# Patient Record
Sex: Female | Born: 1950 | Race: White | Hispanic: No | Smoking: Never smoker
Health system: Southern US, Community
[De-identification: ages and names within clinical notes are randomized; demographics above are authoritative.]

## PROBLEM LIST (undated history)

## (undated) DIAGNOSIS — E785 Hyperlipidemia, unspecified: Secondary | ICD-10-CM

## (undated) DIAGNOSIS — E079 Disorder of thyroid, unspecified: Secondary | ICD-10-CM

## (undated) DIAGNOSIS — I10 Essential (primary) hypertension: Secondary | ICD-10-CM

## (undated) DIAGNOSIS — N289 Disorder of kidney and ureter, unspecified: Secondary | ICD-10-CM

## (undated) HISTORY — PX: MASTECTOMY: SHX3

## (undated) HISTORY — PX: ABDOMINAL HYSTERECTOMY: SHX81

## (undated) HISTORY — PX: BREAST SURGERY: SHX581

---

## 2019-05-24 DIAGNOSIS — E039 Hypothyroidism, unspecified: Secondary | ICD-10-CM | POA: Diagnosis not present

## 2019-06-13 DIAGNOSIS — N183 Chronic kidney disease, stage 3 unspecified: Secondary | ICD-10-CM | POA: Diagnosis not present

## 2019-06-13 DIAGNOSIS — I10 Essential (primary) hypertension: Secondary | ICD-10-CM | POA: Diagnosis not present

## 2019-06-13 DIAGNOSIS — E039 Hypothyroidism, unspecified: Secondary | ICD-10-CM | POA: Diagnosis not present

## 2019-09-15 DIAGNOSIS — E7800 Pure hypercholesterolemia, unspecified: Secondary | ICD-10-CM | POA: Diagnosis not present

## 2019-09-15 DIAGNOSIS — I10 Essential (primary) hypertension: Secondary | ICD-10-CM | POA: Diagnosis not present

## 2019-09-15 DIAGNOSIS — N183 Chronic kidney disease, stage 3 unspecified: Secondary | ICD-10-CM | POA: Diagnosis not present

## 2019-09-29 DIAGNOSIS — N289 Disorder of kidney and ureter, unspecified: Secondary | ICD-10-CM | POA: Diagnosis not present

## 2019-10-07 DIAGNOSIS — Z9013 Acquired absence of bilateral breasts and nipples: Secondary | ICD-10-CM | POA: Diagnosis not present

## 2020-03-09 DIAGNOSIS — M6530 Trigger finger, unspecified finger: Secondary | ICD-10-CM | POA: Diagnosis not present

## 2020-03-09 DIAGNOSIS — E7800 Pure hypercholesterolemia, unspecified: Secondary | ICD-10-CM | POA: Diagnosis not present

## 2020-03-09 DIAGNOSIS — Z23 Encounter for immunization: Secondary | ICD-10-CM | POA: Diagnosis not present

## 2020-03-09 DIAGNOSIS — Z0000 Encounter for general adult medical examination without abnormal findings: Secondary | ICD-10-CM | POA: Diagnosis not present

## 2020-03-09 DIAGNOSIS — N183 Chronic kidney disease, stage 3 unspecified: Secondary | ICD-10-CM | POA: Diagnosis not present

## 2020-03-09 DIAGNOSIS — Z1211 Encounter for screening for malignant neoplasm of colon: Secondary | ICD-10-CM | POA: Diagnosis not present

## 2020-03-09 DIAGNOSIS — E039 Hypothyroidism, unspecified: Secondary | ICD-10-CM | POA: Diagnosis not present

## 2020-03-09 DIAGNOSIS — I10 Essential (primary) hypertension: Secondary | ICD-10-CM | POA: Diagnosis not present

## 2020-03-26 DIAGNOSIS — Z1211 Encounter for screening for malignant neoplasm of colon: Secondary | ICD-10-CM | POA: Diagnosis not present

## 2020-04-23 DIAGNOSIS — E039 Hypothyroidism, unspecified: Secondary | ICD-10-CM | POA: Diagnosis not present

## 2020-06-06 DIAGNOSIS — E039 Hypothyroidism, unspecified: Secondary | ICD-10-CM | POA: Diagnosis not present

## 2020-08-27 DIAGNOSIS — L609 Nail disorder, unspecified: Secondary | ICD-10-CM | POA: Diagnosis not present

## 2020-08-27 DIAGNOSIS — N183 Chronic kidney disease, stage 3 unspecified: Secondary | ICD-10-CM | POA: Diagnosis not present

## 2020-08-27 DIAGNOSIS — E039 Hypothyroidism, unspecified: Secondary | ICD-10-CM | POA: Diagnosis not present

## 2020-08-27 DIAGNOSIS — I10 Essential (primary) hypertension: Secondary | ICD-10-CM | POA: Diagnosis not present

## 2020-09-27 DIAGNOSIS — L578 Other skin changes due to chronic exposure to nonionizing radiation: Secondary | ICD-10-CM | POA: Diagnosis not present

## 2020-09-27 DIAGNOSIS — L570 Actinic keratosis: Secondary | ICD-10-CM | POA: Diagnosis not present

## 2020-09-27 DIAGNOSIS — L814 Other melanin hyperpigmentation: Secondary | ICD-10-CM | POA: Diagnosis not present

## 2020-09-27 DIAGNOSIS — D1801 Hemangioma of skin and subcutaneous tissue: Secondary | ICD-10-CM | POA: Diagnosis not present

## 2020-09-27 DIAGNOSIS — Z411 Encounter for cosmetic surgery: Secondary | ICD-10-CM | POA: Diagnosis not present

## 2020-09-27 DIAGNOSIS — I781 Nevus, non-neoplastic: Secondary | ICD-10-CM | POA: Diagnosis not present

## 2020-09-27 DIAGNOSIS — L821 Other seborrheic keratosis: Secondary | ICD-10-CM | POA: Diagnosis not present

## 2020-09-27 DIAGNOSIS — B079 Viral wart, unspecified: Secondary | ICD-10-CM | POA: Diagnosis not present

## 2020-09-27 DIAGNOSIS — B078 Other viral warts: Secondary | ICD-10-CM | POA: Diagnosis not present

## 2020-10-02 DIAGNOSIS — N1832 Chronic kidney disease, stage 3b: Secondary | ICD-10-CM | POA: Diagnosis not present

## 2020-10-02 DIAGNOSIS — E785 Hyperlipidemia, unspecified: Secondary | ICD-10-CM | POA: Diagnosis not present

## 2020-10-02 DIAGNOSIS — I129 Hypertensive chronic kidney disease with stage 1 through stage 4 chronic kidney disease, or unspecified chronic kidney disease: Secondary | ICD-10-CM | POA: Diagnosis not present

## 2020-12-22 ENCOUNTER — Other Ambulatory Visit: Payer: Self-pay

## 2020-12-22 ENCOUNTER — Emergency Department (HOSPITAL_BASED_OUTPATIENT_CLINIC_OR_DEPARTMENT_OTHER): Payer: Medicare PPO

## 2020-12-22 ENCOUNTER — Encounter (HOSPITAL_BASED_OUTPATIENT_CLINIC_OR_DEPARTMENT_OTHER): Payer: Self-pay | Admitting: *Deleted

## 2020-12-22 ENCOUNTER — Emergency Department (HOSPITAL_BASED_OUTPATIENT_CLINIC_OR_DEPARTMENT_OTHER)
Admission: EM | Admit: 2020-12-22 | Discharge: 2020-12-22 | Disposition: A | Discharge status: Home / Self Care | Payer: Medicare PPO | Attending: Emergency Medicine | Admitting: Emergency Medicine

## 2020-12-22 DIAGNOSIS — I10 Essential (primary) hypertension: Secondary | ICD-10-CM | POA: Insufficient documentation

## 2020-12-22 DIAGNOSIS — R350 Frequency of micturition: Secondary | ICD-10-CM | POA: Insufficient documentation

## 2020-12-22 DIAGNOSIS — R1012 Left upper quadrant pain: Secondary | ICD-10-CM | POA: Insufficient documentation

## 2020-12-22 DIAGNOSIS — Z9010 Acquired absence of unspecified breast and nipple: Secondary | ICD-10-CM | POA: Diagnosis not present

## 2020-12-22 DIAGNOSIS — Z79899 Other long term (current) drug therapy: Secondary | ICD-10-CM | POA: Insufficient documentation

## 2020-12-22 DIAGNOSIS — R109 Unspecified abdominal pain: Secondary | ICD-10-CM | POA: Diagnosis not present

## 2020-12-22 DIAGNOSIS — K5900 Constipation, unspecified: Secondary | ICD-10-CM | POA: Diagnosis not present

## 2020-12-22 DIAGNOSIS — M545 Low back pain, unspecified: Secondary | ICD-10-CM | POA: Diagnosis not present

## 2020-12-22 HISTORY — DX: Hyperlipidemia, unspecified: E78.5

## 2020-12-22 HISTORY — DX: Essential (primary) hypertension: I10

## 2020-12-22 HISTORY — DX: Disorder of kidney and ureter, unspecified: N28.9

## 2020-12-22 HISTORY — DX: Disorder of thyroid, unspecified: E07.9

## 2020-12-22 LAB — COMPREHENSIVE METABOLIC PANEL
ALT: 17 U/L (ref 0–44)
AST: 25 U/L (ref 15–41)
Albumin: 4.8 g/dL (ref 3.5–5.0)
Alkaline Phosphatase: 93 U/L (ref 38–126)
Anion gap: 11 (ref 5–15)
BUN: 15 mg/dL (ref 8–23)
CO2: 27 mmol/L (ref 22–32)
Calcium: 10.3 mg/dL (ref 8.9–10.3)
Chloride: 100 mmol/L (ref 98–111)
Creatinine, Ser: 1.23 mg/dL — ABNORMAL HIGH (ref 0.44–1.00)
GFR, Estimated: 48 mL/min — ABNORMAL LOW (ref >60)
Glucose, Bld: 79 mg/dL (ref 70–99)
Potassium: 3.5 mmol/L (ref 3.5–5.1)
Sodium: 138 mmol/L (ref 135–145)
Total Bilirubin: 0.7 mg/dL (ref 0.3–1.2)
Total Protein: 7.3 g/dL (ref 6.5–8.1)

## 2020-12-22 LAB — CBC
HCT: 39.3 % (ref 36.0–46.0)
Hemoglobin: 13.1 g/dL (ref 12.0–15.0)
MCH: 27.8 pg (ref 26.0–34.0)
MCHC: 33.3 g/dL (ref 30.0–36.0)
MCV: 83.3 fL (ref 80.0–100.0)
Platelets: 309 10*3/uL (ref 150–400)
RBC: 4.72 MIL/uL (ref 3.87–5.11)
RDW: 14.5 % (ref 11.5–15.5)
WBC: 6.8 10*3/uL (ref 4.0–10.5)
nRBC: 0 % (ref 0.0–0.2)

## 2020-12-22 LAB — URINALYSIS, ROUTINE W REFLEX MICROSCOPIC
Bilirubin Urine: NEGATIVE
Glucose, UA: NEGATIVE mg/dL
Ketones, ur: NEGATIVE mg/dL
Leukocytes,Ua: NEGATIVE
Nitrite: NEGATIVE
Protein, ur: NEGATIVE mg/dL
Specific Gravity, Urine: 1.01 (ref 1.005–1.030)
pH: 7 (ref 5.0–8.0)

## 2020-12-22 MED ORDER — SODIUM CHLORIDE 0.9 % IV BOLUS
500.0000 mL | Freq: Once | INTRAVENOUS | Status: AC
  Administered 2020-12-22: 500 mL via INTRAVENOUS

## 2020-12-22 MED ORDER — ONDANSETRON HCL 4 MG/2ML IJ SOLN: 4.0000 mg | Freq: Once | INTRAMUSCULAR | Status: DC

## 2020-12-22 MED ORDER — FENTANYL CITRATE PF 50 MCG/ML IJ SOSY: 50.0000 ug | PREFILLED_SYRINGE | Freq: Once | INTRAMUSCULAR | Status: DC

## 2020-12-22 NOTE — ED Triage Notes (Addendum)
Pt noticed pain in left flank/back area yesterday after putting up Christmas tree yesterday, more frequent urination last night, only gets a lttle relief when lying on her back.

## 2020-12-22 NOTE — ED Provider Notes (Signed)
[facility] EMERGENCY DEPT Provider Note   CSN: [id number] Arrival date & time: 12/22/20  0909     History Chief Complaint  Patient presents with   Flank Pain    Danielle Ramirez is a 70 y.o. female.  HPI    70 year old female presents today complaining of left-sided flank pain that began last night.  Onset was fairly abrupt.  She has had pain to 9 out of 10.  She took acetaminophen without relief.  Pain is sharp and severe.  She has not had similar symptoms in the past.  She has some increased frequency of urination up to 3 episodes last night with some pain.  She has not noted hematuria.  She has no past medical history of kidney stones and has not identified this pain is similar to any pain in the past.  Pain radiates up from the mid back area but does not really come into the abdomen.  She states the position of comfort is flat on her back.  It is worsened with being up and moving around.  She had nausea with episodes of vomiting last night with the pain.  She has not vomited today.  She took her morning medications and had some sips of coffee.  Past Medical History:  Diagnosis Date   Hyperlipemia    Hypertension    Renal disorder    Thyroid disease     There are no problems to display for this patient.   Past Surgical History:  Procedure Laterality Date   ABDOMINAL HYSTERECTOMY     BREAST SURGERY     MASTECTOMY       OB History   No obstetric history on file.     No family history on file.  Social History   Tobacco Use   Smoking status: Never   Smokeless tobacco: Never  Vaping Use   Vaping Use: Some days  Substance Use Topics   Alcohol use: Yes   Drug use: Never    Home Medications Prior to Admission medications   Medication Sig Start Date End Date Taking? Authorizing Provider  amLODipine (NORVASC) 10 MG tablet Take 5 mg by mouth daily.   Yes [provider]  atenolol (TENORMIN) 50 MG tablet Take 50 mg by mouth 2 (two) times daily.    Yes [provider]  desvenlafaxine (PRISTIQ) 100 MG 24 hr tablet Take 100 mg by mouth daily.   Yes [provider]  levothyroxine (SYNTHROID) 100 MCG tablet Take 100 mcg by mouth daily before breakfast.   Yes [provider]    Allergies    Erythromycin  Review of Systems   Review of Systems  All other systems reviewed and are negative.  Physical Exam Updated Vital Signs BP (!) 138/56   Pulse (!) 50   Temp 98.2 F (36.8 C) (Oral)   Resp 16   Ht 1.626 m (5\' 4" )   Wt 68 kg   SpO2 100%   BMI 25.75 kg/m   Physical Exam Vitals and nursing note reviewed.  Constitutional:      General: She is in acute distress.     Appearance: Normal appearance. She is not ill-appearing.  HENT:     Head: Normocephalic and atraumatic.     Right Ear: External ear normal.     Left Ear: External ear normal.     Nose: Nose normal.     Mouth/Throat:     Pharynx: Oropharynx is clear.  Eyes:     Extraocular  Movements: Extraocular movements intact.     Pupils: Pupils are equal, round, and reactive to light.  Cardiovascular:     Rate and Rhythm: Normal rate and regular rhythm.     Pulses: Normal pulses.     Heart sounds: Normal heart sounds.  Pulmonary:     Effort: Pulmonary effort is normal.  Abdominal:     General: Abdomen is flat.     Palpations: Abdomen is soft.     Tenderness: There is abdominal tenderness.     Comments: Tenderness to palpation left upper quadrant  Musculoskeletal:        General: No swelling, tenderness, deformity or signs of injury. Normal range of motion.     Cervical back: Normal range of motion.     Right lower leg: No edema.     Left lower leg: No edema.     Comments: Left CVA tenderness  Skin:    General: Skin is warm and dry.     Capillary Refill: Capillary refill takes less than 2 seconds.  Neurological:     General: No focal deficit present.     Mental Status: She is alert.  Psychiatric:        Mood and Affect: Mood normal.         Behavior: Behavior normal.    ED Results / Procedures / Treatments   Labs (all labs ordered are listed, but only abnormal results are displayed) Labs Reviewed  COMPREHENSIVE METABOLIC PANEL - Abnormal; Notable for the following components:      Result Value   Creatinine, Ser 1.23 (*)    GFR, Estimated 48 (*)    All other components within normal limits  URINALYSIS, ROUTINE W REFLEX MICROSCOPIC - Abnormal; Notable for the following components:   Color, Urine COLORLESS (*)    Hgb urine dipstick TRACE (*)    All other components within normal limits  CBC    EKG None  Radiology DG Chest Port 1 View  Result Date: 12/22/2020 CLINICAL DATA:  70 year old female with left flank and back pain. EXAM: PORTABLE CHEST 1 VIEW COMPARISON:  CT Abdomen and Pelvis reported separately. FINDINGS: Portable AP upright view at 1000 hours. Normal to mildly hyperinflated lungs. Normal cardiac size and mediastinal contours. Visualized tracheal air column is within normal limits. Allowing for portable technique the lungs are clear. No pneumothorax or pleural effusion. No acute osseous abnormality identified. IMPRESSION: Negative portable chest. Electronically Signed   By: Genevie Ann M.D.   On: 12/22/2020 10:12   CT Renal Stone Study  Result Date: 12/22/2020 CLINICAL DATA:  Flank pain, kidney stone suspected. Left-sided flank pain. EXAM: CT ABDOMEN AND PELVIS WITHOUT CONTRAST TECHNIQUE: Multidetector CT imaging of the abdomen and pelvis was performed following the standard protocol without IV contrast. COMPARISON:  None. FINDINGS: Lower chest: Few densities at the right lung base could represent small amount atelectasis or scarring. There appears to be a tiny diaphragmatic hernia along the posterior aspect of the right hemidiaphragm that is likely an incidental finding. Hepatobiliary: Normal appearance of the liver and gallbladder. Pancreas: Unremarkable. No pancreatic ductal dilatation or surrounding  inflammatory changes. Spleen: Normal in size without focal abnormality. Adrenals/Urinary Tract: Normal appearance of the adrenal glands. Normal appearance of both kidneys without kidney stones. No hydronephrosis. Indeterminate calcification in the right hemipelvis near the right ureter on sequence 2 image 56. This calcification measures roughly 9 mm and likely vascular in etiology due to the lack of right ureter dilatation. Normal appearance of the urinary  bladder. Stomach/Bowel: Moderate amount of stool in the colon. Oval-shaped density in the distal ileum is most compatible with an ingested pill. Appendix is not confidently identified but no inflammatory changes around the cecum. Vascular/Lymphatic: Atherosclerotic calcifications. Negative for abdominal aortic aneurysm. No abdominal or pelvic lymph node enlargement. Reproductive: Status post hysterectomy. No adnexal masses. Other: Negative for ascites. Musculoskeletal: No acute bone abnormality. IMPRESSION: 1. No acute abnormality in abdomen or pelvis. 2. No evidence for kidney stones or hydronephrosis. Electronically Signed   By: Markus Daft M.D.   On: 12/22/2020 10:24    Procedures Procedures   Medications Ordered in ED Medications  fentaNYL (SUBLIMAZE) injection 50 mcg (has no administration in time range)  ondansetron (ZOFRAN) injection 4 mg (has no administration in time range)  sodium chloride 0.9 % bolus 500 mL (500 mLs Intravenous New Bag/Given 12/22/20 1023)    ED Course  I have reviewed the triage vital signs and the nursing notes.  Pertinent labs & imaging results that were available during my care of the patient were reviewed by me and considered in my medical decision making (see chart for details).    MDM Rules/Calculators/A&P                          70 year old female with left flank pain who presents today with concern for kidney stone.  Evaluation here in the ED reveals no evidence of acute urinary tract infection,  pyelonephritis, ureteral stone or colic or other acute intra-abdominal etiology for pain. Patient has a history of CKD.  She is advised regarding using acetaminophen and other conservative pain management.  Her pain is worse with movement and this could represent musculoskeletal etiology.  Not appear to have any acute back signs of neurological dysfunction, specifically no weakness, perineal loss of sensation, loss of bowel or bladder control. Patient is advised regarding need for follow-up.  She is discharged in able condition. Final Clinical Impression(s) / ED Diagnoses Final diagnoses:  Acute left-sided low back pain without sciatica    Rx / DC Orders ED Discharge Orders     None        Pattricia Boss, MD 12/22/20 [phone]

## 2020-12-22 NOTE — ED Notes (Signed)
IV removed, per Gabriel Rung - RN.

## 2020-12-22 NOTE — ED Notes (Signed)
Dc instructions reviewed with patient. Patient voiced understanding. Dc with belongings.  °

## 2020-12-22 NOTE — ED Notes (Signed)
Patient transported to CT 

## 2020-12-24 ENCOUNTER — Other Ambulatory Visit: Payer: Self-pay | Admitting: Internal Medicine

## 2020-12-24 DIAGNOSIS — M545 Low back pain, unspecified: Secondary | ICD-10-CM | POA: Diagnosis not present

## 2020-12-24 DIAGNOSIS — I129 Hypertensive chronic kidney disease with stage 1 through stage 4 chronic kidney disease, or unspecified chronic kidney disease: Secondary | ICD-10-CM

## 2020-12-24 DIAGNOSIS — E785 Hyperlipidemia, unspecified: Secondary | ICD-10-CM

## 2020-12-24 DIAGNOSIS — N1832 Chronic kidney disease, stage 3b: Secondary | ICD-10-CM

## 2021-01-02 ENCOUNTER — Ambulatory Visit
Admission: RE | Admit: 2021-01-02 | Discharge: 2021-01-02 | Disposition: A | Discharge status: Home / Self Care | Payer: Medicare PPO | Source: Ambulatory Visit | Attending: Internal Medicine | Admitting: Internal Medicine

## 2021-01-02 DIAGNOSIS — N1832 Chronic kidney disease, stage 3b: Secondary | ICD-10-CM

## 2021-01-02 DIAGNOSIS — I129 Hypertensive chronic kidney disease with stage 1 through stage 4 chronic kidney disease, or unspecified chronic kidney disease: Secondary | ICD-10-CM | POA: Diagnosis not present

## 2021-01-02 DIAGNOSIS — N189 Chronic kidney disease, unspecified: Secondary | ICD-10-CM | POA: Diagnosis not present

## 2021-01-02 DIAGNOSIS — E785 Hyperlipidemia, unspecified: Secondary | ICD-10-CM

## 2021-01-15 DIAGNOSIS — J209 Acute bronchitis, unspecified: Secondary | ICD-10-CM | POA: Diagnosis not present

## 2021-01-15 DIAGNOSIS — J019 Acute sinusitis, unspecified: Secondary | ICD-10-CM | POA: Diagnosis not present

## 2021-03-04 DIAGNOSIS — R059 Cough, unspecified: Secondary | ICD-10-CM | POA: Diagnosis not present

## 2021-03-04 DIAGNOSIS — R0981 Nasal congestion: Secondary | ICD-10-CM | POA: Diagnosis not present

## 2021-03-04 DIAGNOSIS — Z03818 Encounter for observation for suspected exposure to other biological agents ruled out: Secondary | ICD-10-CM | POA: Diagnosis not present

## 2021-03-04 DIAGNOSIS — J069 Acute upper respiratory infection, unspecified: Secondary | ICD-10-CM | POA: Diagnosis not present

## 2021-03-22 DIAGNOSIS — N183 Chronic kidney disease, stage 3 unspecified: Secondary | ICD-10-CM | POA: Diagnosis not present

## 2021-03-22 DIAGNOSIS — Z1211 Encounter for screening for malignant neoplasm of colon: Secondary | ICD-10-CM | POA: Diagnosis not present

## 2021-03-22 DIAGNOSIS — E7800 Pure hypercholesterolemia, unspecified: Secondary | ICD-10-CM | POA: Diagnosis not present

## 2021-03-22 DIAGNOSIS — E039 Hypothyroidism, unspecified: Secondary | ICD-10-CM | POA: Diagnosis not present

## 2021-03-22 DIAGNOSIS — Z0000 Encounter for general adult medical examination without abnormal findings: Secondary | ICD-10-CM | POA: Diagnosis not present

## 2021-03-22 DIAGNOSIS — I10 Essential (primary) hypertension: Secondary | ICD-10-CM | POA: Diagnosis not present

## 2021-03-22 DIAGNOSIS — F322 Major depressive disorder, single episode, severe without psychotic features: Secondary | ICD-10-CM | POA: Diagnosis not present

## 2021-04-02 DIAGNOSIS — Z1211 Encounter for screening for malignant neoplasm of colon: Secondary | ICD-10-CM | POA: Diagnosis not present

## 2021-08-21 DIAGNOSIS — F322 Major depressive disorder, single episode, severe without psychotic features: Secondary | ICD-10-CM | POA: Diagnosis not present

## 2021-08-21 DIAGNOSIS — R609 Edema, unspecified: Secondary | ICD-10-CM | POA: Diagnosis not present

## 2021-08-21 DIAGNOSIS — R413 Other amnesia: Secondary | ICD-10-CM | POA: Diagnosis not present

## 2021-08-21 DIAGNOSIS — N183 Chronic kidney disease, stage 3 unspecified: Secondary | ICD-10-CM | POA: Diagnosis not present

## 2021-08-21 DIAGNOSIS — E039 Hypothyroidism, unspecified: Secondary | ICD-10-CM | POA: Diagnosis not present

## 2021-08-21 DIAGNOSIS — I10 Essential (primary) hypertension: Secondary | ICD-10-CM | POA: Diagnosis not present

## 2021-09-24 DIAGNOSIS — I10 Essential (primary) hypertension: Secondary | ICD-10-CM | POA: Diagnosis not present

## 2021-09-24 DIAGNOSIS — E039 Hypothyroidism, unspecified: Secondary | ICD-10-CM | POA: Diagnosis not present

## 2021-09-24 DIAGNOSIS — E673 Hypervitaminosis D: Secondary | ICD-10-CM | POA: Diagnosis not present

## 2021-09-24 DIAGNOSIS — Z23 Encounter for immunization: Secondary | ICD-10-CM | POA: Diagnosis not present

## 2021-10-08 DIAGNOSIS — E673 Hypervitaminosis D: Secondary | ICD-10-CM | POA: Diagnosis not present

## 2021-10-08 DIAGNOSIS — E039 Hypothyroidism, unspecified: Secondary | ICD-10-CM | POA: Diagnosis not present

## 2021-10-15 DIAGNOSIS — B078 Other viral warts: Secondary | ICD-10-CM | POA: Diagnosis not present

## 2021-10-15 DIAGNOSIS — L821 Other seborrheic keratosis: Secondary | ICD-10-CM | POA: Diagnosis not present

## 2021-10-15 DIAGNOSIS — L814 Other melanin hyperpigmentation: Secondary | ICD-10-CM | POA: Diagnosis not present

## 2021-10-15 DIAGNOSIS — L820 Inflamed seborrheic keratosis: Secondary | ICD-10-CM | POA: Diagnosis not present

## 2021-10-15 DIAGNOSIS — D2271 Melanocytic nevi of right lower limb, including hip: Secondary | ICD-10-CM | POA: Diagnosis not present

## 2021-10-15 DIAGNOSIS — D0371 Melanoma in situ of right lower limb, including hip: Secondary | ICD-10-CM | POA: Diagnosis not present

## 2021-10-15 DIAGNOSIS — D229 Melanocytic nevi, unspecified: Secondary | ICD-10-CM | POA: Diagnosis not present

## 2021-10-15 DIAGNOSIS — L739 Follicular disorder, unspecified: Secondary | ICD-10-CM | POA: Diagnosis not present

## 2021-10-15 DIAGNOSIS — L570 Actinic keratosis: Secondary | ICD-10-CM | POA: Diagnosis not present

## 2021-10-15 DIAGNOSIS — L578 Other skin changes due to chronic exposure to nonionizing radiation: Secondary | ICD-10-CM | POA: Diagnosis not present

## 2021-10-15 DIAGNOSIS — C44311 Basal cell carcinoma of skin of nose: Secondary | ICD-10-CM | POA: Diagnosis not present

## 2021-10-15 DIAGNOSIS — D485 Neoplasm of uncertain behavior of skin: Secondary | ICD-10-CM | POA: Diagnosis not present

## 2021-10-21 DIAGNOSIS — D0371 Melanoma in situ of right lower limb, including hip: Secondary | ICD-10-CM | POA: Diagnosis not present

## 2021-10-30 DIAGNOSIS — C44311 Basal cell carcinoma of skin of nose: Secondary | ICD-10-CM | POA: Diagnosis not present

## 2021-11-14 DIAGNOSIS — J0100 Acute maxillary sinusitis, unspecified: Secondary | ICD-10-CM | POA: Diagnosis not present

## 2022-01-02 DIAGNOSIS — Z5189 Encounter for other specified aftercare: Secondary | ICD-10-CM | POA: Diagnosis not present

## 2022-01-02 DIAGNOSIS — Z85828 Personal history of other malignant neoplasm of skin: Secondary | ICD-10-CM | POA: Diagnosis not present

## 2022-01-21 DIAGNOSIS — E673 Hypervitaminosis D: Secondary | ICD-10-CM | POA: Diagnosis not present

## 2022-01-21 DIAGNOSIS — E039 Hypothyroidism, unspecified: Secondary | ICD-10-CM | POA: Diagnosis not present

## 2022-02-04 DIAGNOSIS — N183 Chronic kidney disease, stage 3 unspecified: Secondary | ICD-10-CM | POA: Diagnosis not present

## 2022-02-04 DIAGNOSIS — U071 COVID-19: Secondary | ICD-10-CM | POA: Diagnosis not present

## 2022-02-14 DIAGNOSIS — J988 Other specified respiratory disorders: Secondary | ICD-10-CM | POA: Diagnosis not present

## 2022-02-26 DIAGNOSIS — F331 Major depressive disorder, recurrent, moderate: Secondary | ICD-10-CM | POA: Diagnosis not present

## 2022-02-26 DIAGNOSIS — N1831 Chronic kidney disease, stage 3a: Secondary | ICD-10-CM | POA: Diagnosis not present

## 2022-02-26 DIAGNOSIS — J069 Acute upper respiratory infection, unspecified: Secondary | ICD-10-CM | POA: Diagnosis not present

## 2022-02-26 DIAGNOSIS — J3489 Other specified disorders of nose and nasal sinuses: Secondary | ICD-10-CM | POA: Diagnosis not present

## 2022-02-26 DIAGNOSIS — R058 Other specified cough: Secondary | ICD-10-CM | POA: Diagnosis not present

## 2022-04-03 DIAGNOSIS — E039 Hypothyroidism, unspecified: Secondary | ICD-10-CM | POA: Diagnosis not present

## 2022-04-03 DIAGNOSIS — J302 Other seasonal allergic rhinitis: Secondary | ICD-10-CM | POA: Diagnosis not present

## 2022-04-03 DIAGNOSIS — Z1211 Encounter for screening for malignant neoplasm of colon: Secondary | ICD-10-CM | POA: Diagnosis not present

## 2022-04-03 DIAGNOSIS — F325 Major depressive disorder, single episode, in full remission: Secondary | ICD-10-CM | POA: Diagnosis not present

## 2022-04-03 DIAGNOSIS — I10 Essential (primary) hypertension: Secondary | ICD-10-CM | POA: Diagnosis not present

## 2022-04-03 DIAGNOSIS — N1831 Chronic kidney disease, stage 3a: Secondary | ICD-10-CM | POA: Diagnosis not present

## 2022-04-03 DIAGNOSIS — E2839 Other primary ovarian failure: Secondary | ICD-10-CM | POA: Diagnosis not present

## 2022-04-03 DIAGNOSIS — Z0001 Encounter for general adult medical examination with abnormal findings: Secondary | ICD-10-CM | POA: Diagnosis not present

## 2022-04-04 ENCOUNTER — Other Ambulatory Visit: Payer: Self-pay | Admitting: Family Medicine

## 2022-04-04 DIAGNOSIS — E2839 Other primary ovarian failure: Secondary | ICD-10-CM

## 2022-04-10 DIAGNOSIS — H2513 Age-related nuclear cataract, bilateral: Secondary | ICD-10-CM | POA: Diagnosis not present

## 2022-04-10 DIAGNOSIS — H52223 Regular astigmatism, bilateral: Secondary | ICD-10-CM | POA: Diagnosis not present

## 2022-04-10 DIAGNOSIS — H524 Presbyopia: Secondary | ICD-10-CM | POA: Diagnosis not present

## 2022-04-10 DIAGNOSIS — H5203 Hypermetropia, bilateral: Secondary | ICD-10-CM | POA: Diagnosis not present

## 2022-04-15 DIAGNOSIS — D229 Melanocytic nevi, unspecified: Secondary | ICD-10-CM | POA: Diagnosis not present

## 2022-04-15 DIAGNOSIS — L821 Other seborrheic keratosis: Secondary | ICD-10-CM | POA: Diagnosis not present

## 2022-04-15 DIAGNOSIS — L570 Actinic keratosis: Secondary | ICD-10-CM | POA: Diagnosis not present

## 2022-04-15 DIAGNOSIS — L814 Other melanin hyperpigmentation: Secondary | ICD-10-CM | POA: Diagnosis not present

## 2022-04-15 DIAGNOSIS — L578 Other skin changes due to chronic exposure to nonionizing radiation: Secondary | ICD-10-CM | POA: Diagnosis not present

## 2022-04-15 DIAGNOSIS — D1801 Hemangioma of skin and subcutaneous tissue: Secondary | ICD-10-CM | POA: Diagnosis not present

## 2022-04-15 DIAGNOSIS — T8189XD Other complications of procedures, not elsewhere classified, subsequent encounter: Secondary | ICD-10-CM | POA: Diagnosis not present

## 2022-04-16 DIAGNOSIS — Z1211 Encounter for screening for malignant neoplasm of colon: Secondary | ICD-10-CM | POA: Diagnosis not present

## 2022-04-29 DIAGNOSIS — Z79899 Other long term (current) drug therapy: Secondary | ICD-10-CM | POA: Diagnosis not present

## 2022-05-14 DIAGNOSIS — J302 Other seasonal allergic rhinitis: Secondary | ICD-10-CM | POA: Diagnosis not present

## 2022-05-14 DIAGNOSIS — I10 Essential (primary) hypertension: Secondary | ICD-10-CM | POA: Diagnosis not present

## 2022-05-14 DIAGNOSIS — J4520 Mild intermittent asthma, uncomplicated: Secondary | ICD-10-CM | POA: Diagnosis not present

## 2022-08-20 DIAGNOSIS — F322 Major depressive disorder, single episode, severe without psychotic features: Secondary | ICD-10-CM | POA: Diagnosis not present

## 2022-09-17 DIAGNOSIS — E039 Hypothyroidism, unspecified: Secondary | ICD-10-CM | POA: Diagnosis not present

## 2022-09-17 DIAGNOSIS — Z79899 Other long term (current) drug therapy: Secondary | ICD-10-CM | POA: Diagnosis not present

## 2022-09-17 DIAGNOSIS — F329 Major depressive disorder, single episode, unspecified: Secondary | ICD-10-CM | POA: Diagnosis not present

## 2022-10-13 DIAGNOSIS — I10 Essential (primary) hypertension: Secondary | ICD-10-CM | POA: Diagnosis not present

## 2022-10-17 ENCOUNTER — Inpatient Hospital Stay: Admission: RE | Admit: 2022-10-17 | Payer: Medicare PPO | Source: Ambulatory Visit

## 2022-10-30 DIAGNOSIS — Z411 Encounter for cosmetic surgery: Secondary | ICD-10-CM | POA: Diagnosis not present

## 2022-10-30 DIAGNOSIS — L578 Other skin changes due to chronic exposure to nonionizing radiation: Secondary | ICD-10-CM | POA: Diagnosis not present

## 2022-10-30 DIAGNOSIS — L570 Actinic keratosis: Secondary | ICD-10-CM | POA: Diagnosis not present

## 2022-10-30 DIAGNOSIS — D229 Melanocytic nevi, unspecified: Secondary | ICD-10-CM | POA: Diagnosis not present

## 2022-10-30 DIAGNOSIS — D2230 Melanocytic nevi of unspecified part of face: Secondary | ICD-10-CM | POA: Diagnosis not present

## 2022-10-30 DIAGNOSIS — L814 Other melanin hyperpigmentation: Secondary | ICD-10-CM | POA: Diagnosis not present

## 2022-10-30 DIAGNOSIS — L821 Other seborrheic keratosis: Secondary | ICD-10-CM | POA: Diagnosis not present

## 2022-10-30 DIAGNOSIS — Z86006 Personal history of melanoma in-situ: Secondary | ICD-10-CM | POA: Diagnosis not present

## 2023-01-01 DIAGNOSIS — I10 Essential (primary) hypertension: Secondary | ICD-10-CM | POA: Diagnosis not present

## 2023-01-01 DIAGNOSIS — J069 Acute upper respiratory infection, unspecified: Secondary | ICD-10-CM | POA: Diagnosis not present

## 2023-01-05 DIAGNOSIS — J988 Other specified respiratory disorders: Secondary | ICD-10-CM | POA: Diagnosis not present

## 2023-01-05 DIAGNOSIS — J0100 Acute maxillary sinusitis, unspecified: Secondary | ICD-10-CM | POA: Diagnosis not present

## 2023-04-10 DIAGNOSIS — M6530 Trigger finger, unspecified finger: Secondary | ICD-10-CM | POA: Diagnosis not present

## 2023-04-10 DIAGNOSIS — Z79899 Other long term (current) drug therapy: Secondary | ICD-10-CM | POA: Diagnosis not present

## 2023-04-10 DIAGNOSIS — I10 Essential (primary) hypertension: Secondary | ICD-10-CM | POA: Diagnosis not present

## 2023-04-10 DIAGNOSIS — E039 Hypothyroidism, unspecified: Secondary | ICD-10-CM | POA: Diagnosis not present

## 2023-04-10 DIAGNOSIS — F322 Major depressive disorder, single episode, severe without psychotic features: Secondary | ICD-10-CM | POA: Diagnosis not present

## 2023-04-10 DIAGNOSIS — Z1211 Encounter for screening for malignant neoplasm of colon: Secondary | ICD-10-CM | POA: Diagnosis not present

## 2023-04-10 DIAGNOSIS — N1831 Chronic kidney disease, stage 3a: Secondary | ICD-10-CM | POA: Diagnosis not present

## 2023-04-10 DIAGNOSIS — Z0001 Encounter for general adult medical examination with abnormal findings: Secondary | ICD-10-CM | POA: Diagnosis not present

## 2023-04-10 DIAGNOSIS — E2839 Other primary ovarian failure: Secondary | ICD-10-CM | POA: Diagnosis not present

## 2023-04-14 ENCOUNTER — Other Ambulatory Visit: Payer: Self-pay | Admitting: Family Medicine

## 2023-04-14 DIAGNOSIS — E2839 Other primary ovarian failure: Secondary | ICD-10-CM

## 2023-05-15 DIAGNOSIS — M65331 Trigger finger, right middle finger: Secondary | ICD-10-CM | POA: Diagnosis not present

## 2023-05-27 DIAGNOSIS — L578 Other skin changes due to chronic exposure to nonionizing radiation: Secondary | ICD-10-CM | POA: Diagnosis not present

## 2023-05-27 DIAGNOSIS — Z86006 Personal history of melanoma in-situ: Secondary | ICD-10-CM | POA: Diagnosis not present

## 2023-05-27 DIAGNOSIS — L821 Other seborrheic keratosis: Secondary | ICD-10-CM | POA: Diagnosis not present

## 2023-05-27 DIAGNOSIS — L814 Other melanin hyperpigmentation: Secondary | ICD-10-CM | POA: Diagnosis not present

## 2023-05-27 DIAGNOSIS — D229 Melanocytic nevi, unspecified: Secondary | ICD-10-CM | POA: Diagnosis not present

## 2023-05-27 DIAGNOSIS — D485 Neoplasm of uncertain behavior of skin: Secondary | ICD-10-CM | POA: Diagnosis not present

## 2023-05-27 DIAGNOSIS — D0472 Carcinoma in situ of skin of left lower limb, including hip: Secondary | ICD-10-CM | POA: Diagnosis not present

## 2023-05-27 DIAGNOSIS — D369 Benign neoplasm, unspecified site: Secondary | ICD-10-CM | POA: Diagnosis not present

## 2023-05-27 DIAGNOSIS — I781 Nevus, non-neoplastic: Secondary | ICD-10-CM | POA: Diagnosis not present

## 2023-05-27 DIAGNOSIS — D2230 Melanocytic nevi of unspecified part of face: Secondary | ICD-10-CM | POA: Diagnosis not present

## 2023-06-03 DIAGNOSIS — Z1211 Encounter for screening for malignant neoplasm of colon: Secondary | ICD-10-CM | POA: Diagnosis not present

## 2023-06-19 DIAGNOSIS — E039 Hypothyroidism, unspecified: Secondary | ICD-10-CM | POA: Diagnosis not present

## 2023-06-23 DIAGNOSIS — D485 Neoplasm of uncertain behavior of skin: Secondary | ICD-10-CM | POA: Diagnosis not present

## 2023-06-23 DIAGNOSIS — C44311 Basal cell carcinoma of skin of nose: Secondary | ICD-10-CM | POA: Diagnosis not present

## 2023-06-23 DIAGNOSIS — D0472 Carcinoma in situ of skin of left lower limb, including hip: Secondary | ICD-10-CM | POA: Diagnosis not present

## 2023-06-23 DIAGNOSIS — C44319 Basal cell carcinoma of skin of other parts of face: Secondary | ICD-10-CM | POA: Diagnosis not present

## 2023-07-02 DIAGNOSIS — C44311 Basal cell carcinoma of skin of nose: Secondary | ICD-10-CM | POA: Diagnosis not present

## 2023-07-09 ENCOUNTER — Ambulatory Visit (HOSPITAL_BASED_OUTPATIENT_CLINIC_OR_DEPARTMENT_OTHER)
Admission: RE | Admit: 2023-07-09 | Discharge: 2023-07-09 | Disposition: A | Discharge status: Home / Self Care | Source: Ambulatory Visit | Attending: Family Medicine | Admitting: Family Medicine

## 2023-07-09 DIAGNOSIS — Z780 Asymptomatic menopausal state: Secondary | ICD-10-CM | POA: Diagnosis not present

## 2023-07-09 DIAGNOSIS — E2839 Other primary ovarian failure: Secondary | ICD-10-CM | POA: Insufficient documentation

## 2023-07-09 DIAGNOSIS — M85852 Other specified disorders of bone density and structure, left thigh: Secondary | ICD-10-CM | POA: Diagnosis not present

## 2023-07-10 DIAGNOSIS — H2513 Age-related nuclear cataract, bilateral: Secondary | ICD-10-CM | POA: Diagnosis not present

## 2023-07-14 DIAGNOSIS — L0889 Other specified local infections of the skin and subcutaneous tissue: Secondary | ICD-10-CM | POA: Diagnosis not present

## 2023-08-07 IMAGING — CT CT RENAL STONE PROTOCOL
2 of 4 series · 16 of 46 positions shown, 18 images · non-contrast
Comparison: None.

CLINICAL DATA: Flank pain, kidney stone suspected. Left-sided flank
pain.

EXAM:
CT ABDOMEN AND PELVIS WITHOUT CONTRAST
TECHNIQUE: Multidetector CT imaging of the abdomen and pelvis was performed
following the standard protocol without IV contrast.

[Series 2: stone full · axial · 0.77mm/px · z∈[+694,+1074]mm · 13 of 84 slices shown, 15 images]
[im 4/84  soft-tissue]
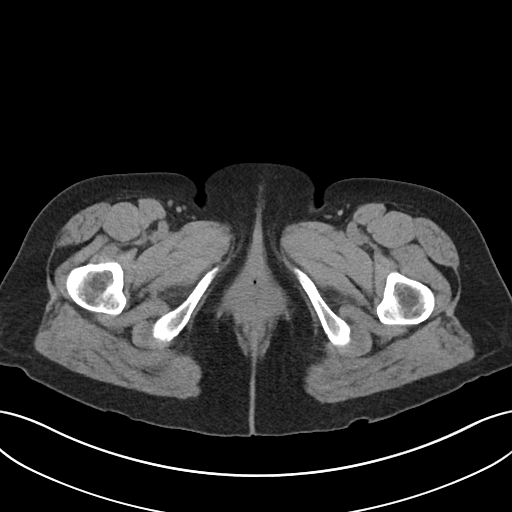
[im 4/84  bone]
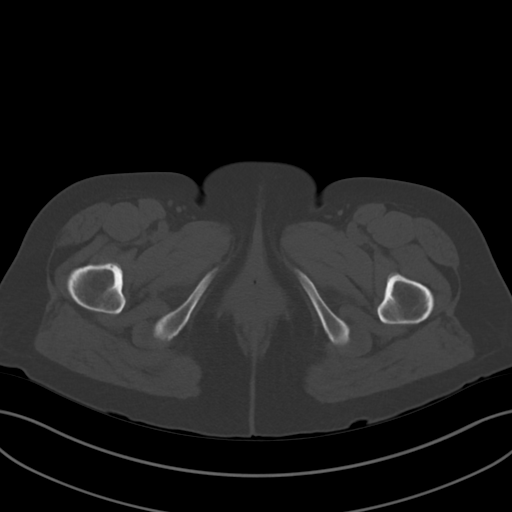
[im 10/84  soft-tissue]
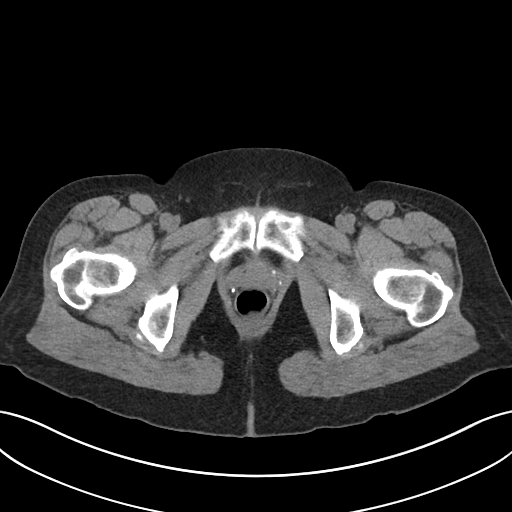
[im 17/84  soft-tissue]
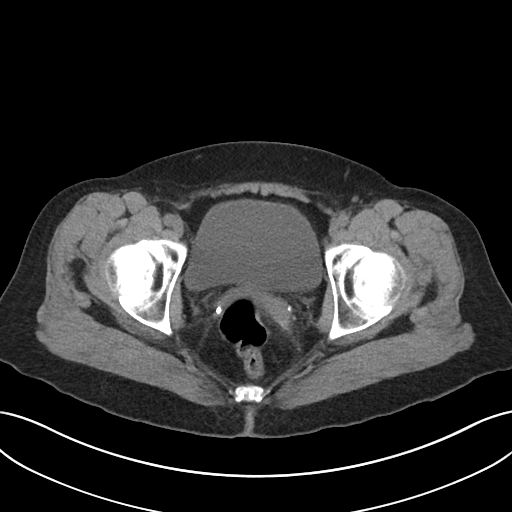
[im 24/84  soft-tissue]
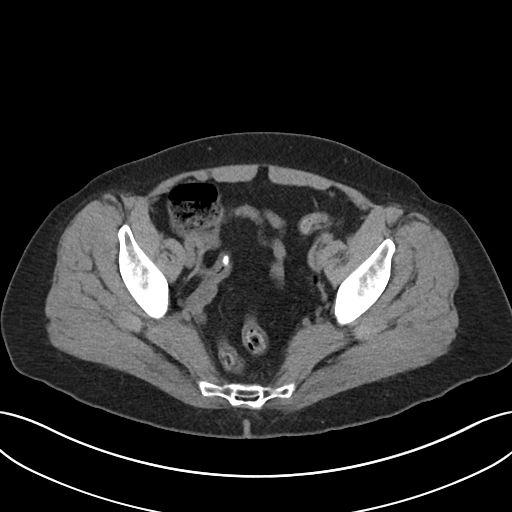
[im 30/84  soft-tissue]
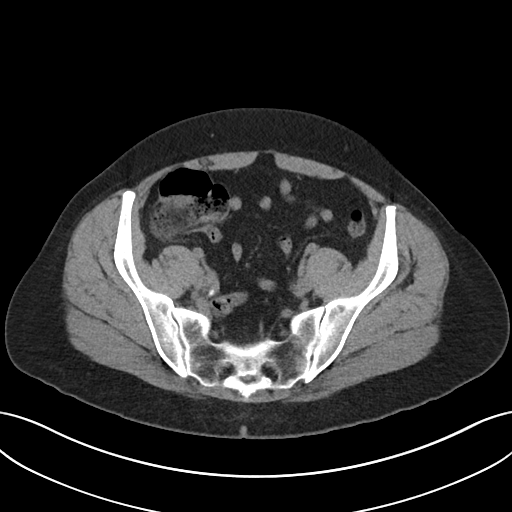
[im 37/84  soft-tissue]
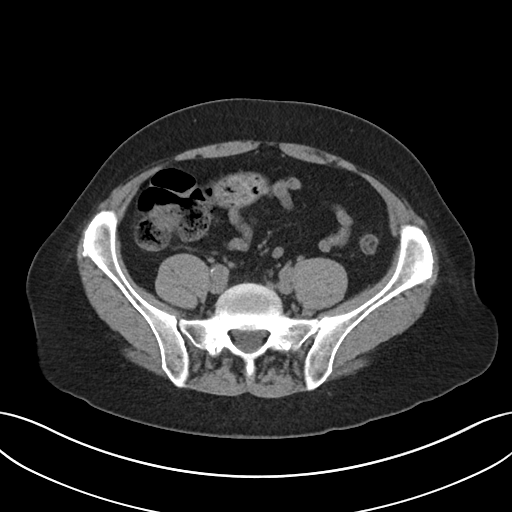
[im 44/84  soft-tissue]
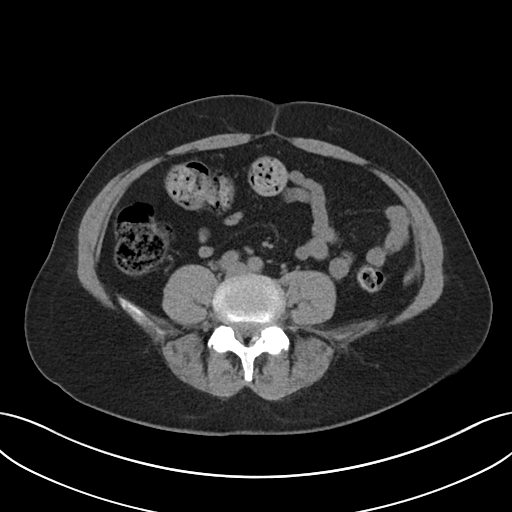
[im 47/84  soft-tissue]
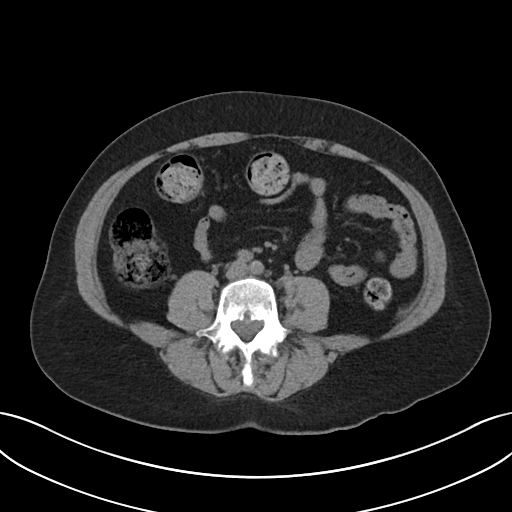
[im 54/84  soft-tissue]
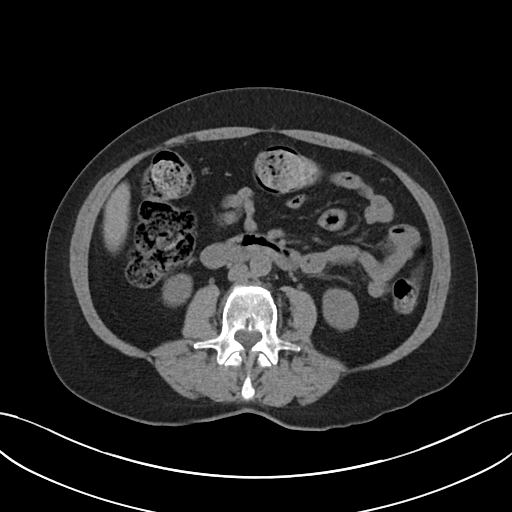
[im 54/84  bone]
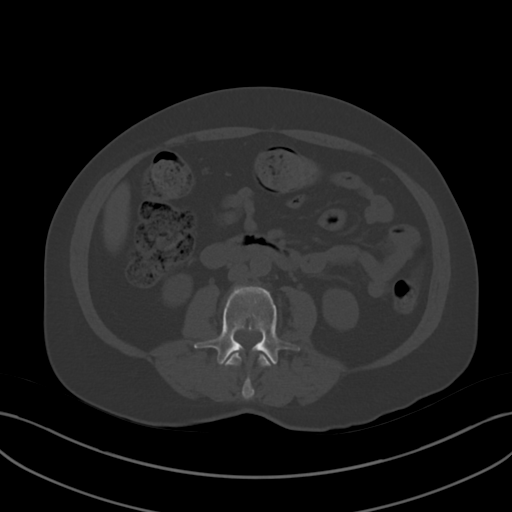
[im 60/84  soft-tissue]
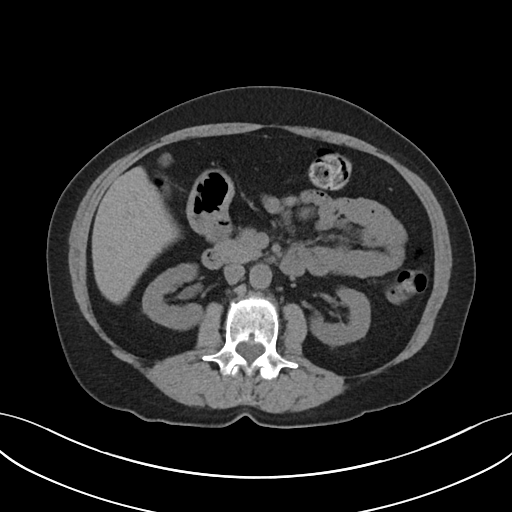
[im 67/84  soft-tissue]
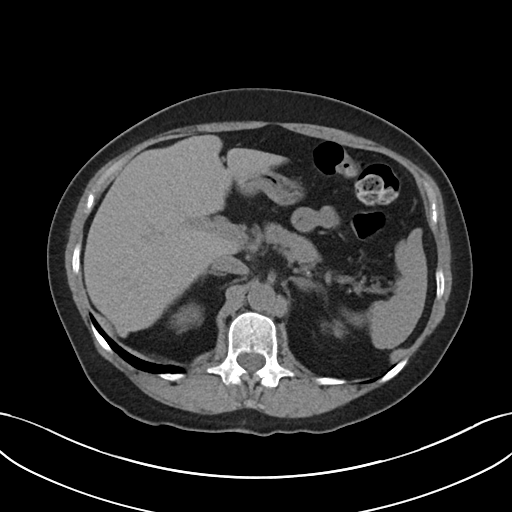
[im 74/84  soft-tissue]
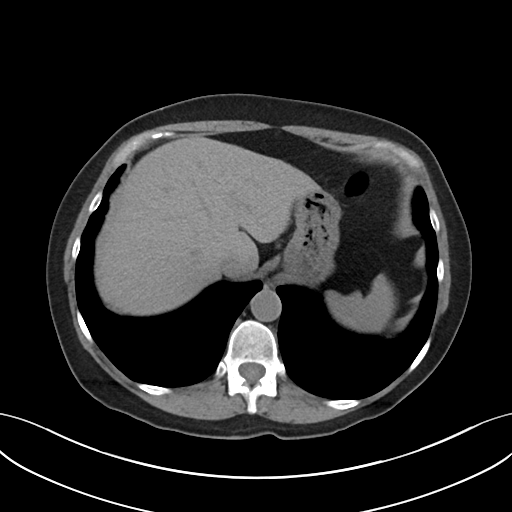
[im 80/84  soft-tissue]
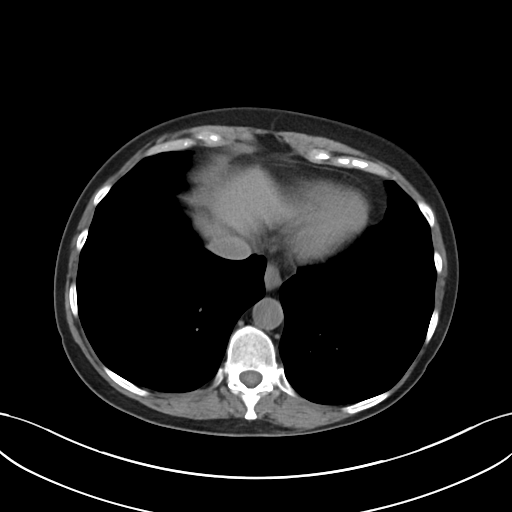

[Series 5: coronal · coronal · 0.70mm/px · 3 of 90 slices shown]
[im 30/90  soft-tissue]
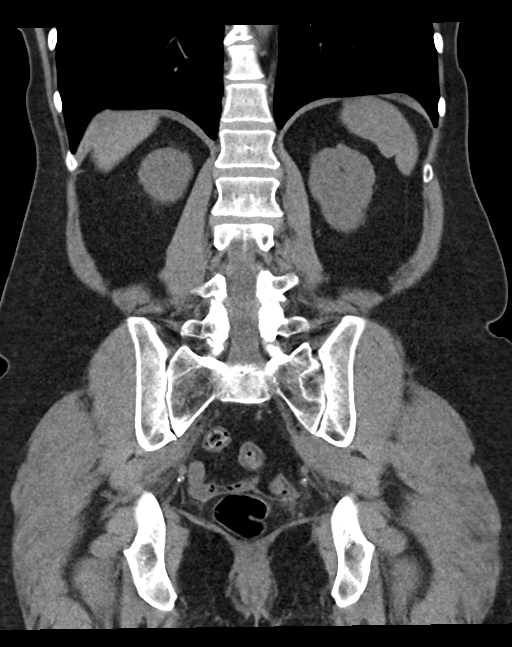
[im 40/90  soft-tissue]
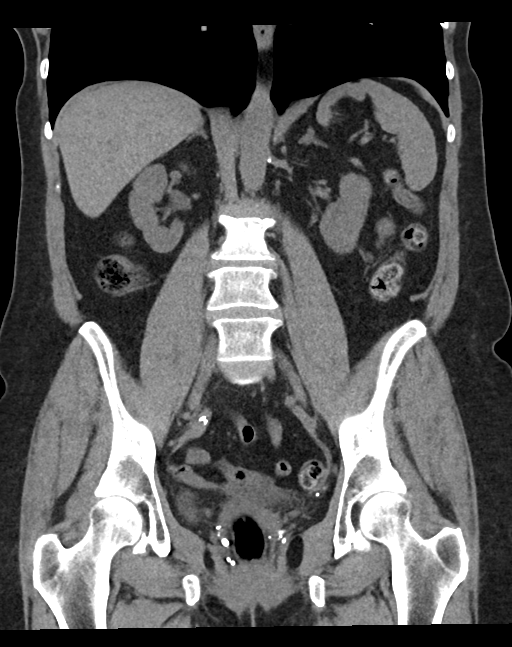
[im 50/90  soft-tissue]
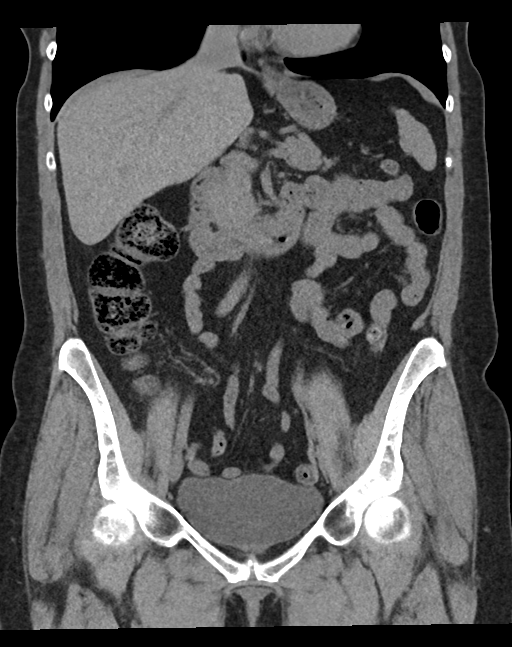

[16 of 46 positions shown; findings below may reference images not displayed]

FINDINGS: Lower chest: Few densities at the right lung base could represent
small amount atelectasis or scarring. There appears to be a tiny
diaphragmatic hernia along the posterior aspect of the right
hemidiaphragm that is likely an incidental finding.

Hepatobiliary: Normal appearance of the liver and gallbladder.

Pancreas: Unremarkable. No pancreatic ductal dilatation or
surrounding inflammatory changes.

Spleen: Normal in size without focal abnormality.

Adrenals/Urinary Tract: Normal appearance of the adrenal glands.
Normal appearance of both kidneys without kidney stones. No
hydronephrosis. Indeterminate calcification in the right hemipelvis
near the right ureter on sequence 2 image 56. This calcification
measures roughly 9 mm and likely vascular in etiology due to the
lack of right ureter dilatation. Normal appearance of the urinary
bladder.

Stomach/Bowel: Moderate amount of stool in the colon. Oval-shaped
density in the distal ileum is most compatible with an ingested
pill. Appendix is not confidently identified but no inflammatory
changes around the cecum.

Vascular/Lymphatic: Atherosclerotic calcifications. Negative for
abdominal aortic aneurysm. No abdominal or pelvic lymph node
enlargement.

Reproductive: Status post hysterectomy. No adnexal masses.

Other: Negative for ascites.

Musculoskeletal: No acute bone abnormality.
IMPRESSION: 1. No acute abnormality in abdomen or pelvis.
2. No evidence for kidney stones or hydronephrosis.

## 2023-10-12 DIAGNOSIS — E039 Hypothyroidism, unspecified: Secondary | ICD-10-CM | POA: Diagnosis not present

## 2023-10-12 DIAGNOSIS — Z23 Encounter for immunization: Secondary | ICD-10-CM | POA: Diagnosis not present

## 2023-10-12 DIAGNOSIS — I10 Essential (primary) hypertension: Secondary | ICD-10-CM | POA: Diagnosis not present

## 2023-10-12 DIAGNOSIS — Z9013 Acquired absence of bilateral breasts and nipples: Secondary | ICD-10-CM | POA: Diagnosis not present

## 2023-10-12 DIAGNOSIS — N1831 Chronic kidney disease, stage 3a: Secondary | ICD-10-CM | POA: Diagnosis not present

## 2023-10-12 DIAGNOSIS — F321 Major depressive disorder, single episode, moderate: Secondary | ICD-10-CM | POA: Diagnosis not present

## 2023-10-24 DIAGNOSIS — M65331 Trigger finger, right middle finger: Secondary | ICD-10-CM | POA: Diagnosis not present

## 2024-01-04 ENCOUNTER — Ambulatory Visit: Admitting: Plastic Surgery

## 2024-01-04 ENCOUNTER — Encounter: Payer: Self-pay | Admitting: Plastic Surgery

## 2024-01-04 VITALS — BP 159/70 | HR 59 | Ht 64.0 in | Wt 151.0 lb

## 2024-01-04 DIAGNOSIS — Z9889 Other specified postprocedural states: Secondary | ICD-10-CM

## 2024-01-05 NOTE — Progress Notes (Signed)
 Referring Provider Kip Righter, MD [address] [name],  [place] [address]   CC:  Chief Complaint  Patient presents with   Advice Only      Danielle Ramirez is an 73 y.o. female.  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  Patient states she has not had any follow-up for her implants since they were placed.  She notes that there is asymmetry with loss of the inframammary fold and flattening of the reconstruction on the right.  Of note the patient apparently had a complication after attempted nipple reconstruction on the left and required a latissimus flap. She is interested in knowing what can be done to obtain better symmetry.  Allergies[1]  Outpatient Encounter Medications as of 01/04/2024  Medication Sig   amLODipine (NORVASC) 10 MG tablet Take 5 mg by mouth daily.   atenolol (TENORMIN) 50 MG tablet Take 50 mg by mouth 2 (two) times daily.   desvenlafaxine (PRISTIQ) 100 MG 24 hr tablet Take 100 mg by mouth daily.   levothyroxine (SYNTHROID) 100 MCG tablet Take 100 mcg by mouth daily before breakfast.   No facility-administered encounter medications on file as of 01/04/2024.     Past Medical History:  Diagnosis Date   Hyperlipemia    Hypertension    Renal disorder    Thyroid  disease     Past Surgical History:  Procedure Laterality Date   ABDOMINAL HYSTERECTOMY     BREAST SURGERY     MASTECTOMY      History reviewed. No pertinent family history.  Social History   Social History Narrative   Not on file     Review of Systems General: Denies fevers, chills, weight loss CV: Denies chest pain, shortness of breath, palpitations Breast: Bilateral mastectomies with implant-based reconstruction and additional latissimus flap on the left  Physical Exam    01/04/2024    9:29 AM 12/22/2020   11:00 AM 12/22/2020    9:28 AM   Vitals with BMI  Height 5' 4  5' 4  Weight 151 lbs  150 lbs  BMI 25.91  25.73  Systolic 159 138   Diastolic 70 56   Pulse 59 50     General:  No acute distress,  Alert and oriented, Non-Toxic, Normal speech and affect Breast: Bilateral reconstructions after mastectomies.  The right breast implant is palpable.  It is somewhat flattened and as noted above the inframammary fold is effaced.  The left breast has very nice shape.  There is a well-placed latissimus dorsi flap as part of the reconstruction. Mammogram: Patient no longer gets mammograms due to her mastectomies. 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  I believe that the left reconstruction is quite nice and does not require any significant modification.  It would be possible to place sutures internally to improve the inframammary fold on the right as well as place an implant with greater projection.  This is a very reasonable option to achieve her goals.  Prior to scheduling surgery though I would like for her to have an MRI to interrogate the implants.  If the left implant shows signs of rupture then it would be reasonable to replace that implant at the same time as her right breast revision.  She is in agreement  with this plan.  Will obtain an MRI and she will follow-up with me 1 to 2 weeks after the study is complete.  Danielle Ramirez 01/05/2024, 3:05 PM         [1]  Allergies Allergen Reactions   Erythromycin Nausea And Vomiting

## 2024-01-06 ENCOUNTER — Institutional Professional Consult (permissible substitution)

## 2024-01-12 ENCOUNTER — Ambulatory Visit (HOSPITAL_COMMUNITY)
Admission: RE | Admit: 2024-01-12 | Discharge: 2024-01-12 | Disposition: A | Discharge status: Home / Self Care | Source: Ambulatory Visit | Attending: Plastic Surgery | Admitting: Plastic Surgery

## 2024-01-12 DIAGNOSIS — Z9889 Other specified postprocedural states: Secondary | ICD-10-CM | POA: Insufficient documentation

## 2024-02-11 ENCOUNTER — Ambulatory Visit: Admitting: Plastic Surgery

## 2024-02-11 VITALS — BP 152/69 | HR 60

## 2024-02-11 DIAGNOSIS — N651 Disproportion of reconstructed breast: Secondary | ICD-10-CM

## 2024-02-11 DIAGNOSIS — Z9889 Other specified postprocedural states: Secondary | ICD-10-CM

## 2024-02-11 NOTE — Progress Notes (Signed)
 Ms. Danielle Ramirez returns today after her MRI.  MRI showed that the implants are in intact and there is no evidence of rupture.  We discussed the procedure 1 more time.  Specifically I will remove the current right breast implant and replace it with an implant with more projection.  I will also attempt to better define the inframammary fold.  This will be done with internal sutures if possible but may include an external excision.  All questions were answered to her satisfaction.  Photographs were obtained today with her consent.  Will schedule her at her request.  30 minutes were spent reviewing the chart, examining the patient, discussing treatment plans, coordinating care, and documenting

## 2024-03-09 ENCOUNTER — Encounter: Admitting: Student

## 2024-03-28 ENCOUNTER — Ambulatory Visit (HOSPITAL_COMMUNITY): Admit: 2024-03-28 | Admitting: Plastic Surgery

## 2024-04-04 ENCOUNTER — Encounter: Admitting: Plastic Surgery

## 2024-04-21 ENCOUNTER — Encounter: Admitting: Plastic Surgery
